# Patient Record
Sex: Male | Born: 1965 | Race: Black or African American | Hispanic: No | Marital: Married | Smoking: Never smoker
Health system: Southern US, Community
[De-identification: ages and names within clinical notes are randomized; demographics above are authoritative.]

## PROBLEM LIST (undated history)

## (undated) DIAGNOSIS — F4310 Post-traumatic stress disorder, unspecified: Secondary | ICD-10-CM

## (undated) DIAGNOSIS — F909 Attention-deficit hyperactivity disorder, unspecified type: Secondary | ICD-10-CM

## (undated) DIAGNOSIS — G4733 Obstructive sleep apnea (adult) (pediatric): Secondary | ICD-10-CM

## (undated) DIAGNOSIS — G43909 Migraine, unspecified, not intractable, without status migrainosus: Secondary | ICD-10-CM

## (undated) HISTORY — PX: NECK SURGERY: SHX720

## (undated) HISTORY — DX: Obstructive sleep apnea (adult) (pediatric): G47.33

## (undated) HISTORY — DX: Migraine, unspecified, not intractable, without status migrainosus: G43.909

## (undated) HISTORY — PX: KNEE SURGERY: SHX244

## (undated) HISTORY — DX: Attention-deficit hyperactivity disorder, unspecified type: F90.9

## (undated) HISTORY — DX: Post-traumatic stress disorder, unspecified: F43.10

---

## 2020-02-10 ENCOUNTER — Ambulatory Visit: Payer: Self-pay | Attending: Internal Medicine

## 2020-02-10 DIAGNOSIS — Z23 Encounter for immunization: Secondary | ICD-10-CM

## 2020-02-10 NOTE — Progress Notes (Signed)
   Covid-19 Vaccination Clinic  Name:  Albert Lawrence    MRN: [id number] DOB: May 03, 1966  02/10/2020  [name] was observed post Covid-19 immunization for 15 minutes without incident. He was provided with Vaccine Information Sheet and instruction to access the V-Safe system.   [name] was instructed to call 911 with any severe reactions post vaccine: Marland Kitchen Difficulty breathing  . Swelling of face and throat  . A fast heartbeat  . A bad rash all over body  . Dizziness and weakness   Immunizations Administered    Name Date Dose VIS Date Route   Pfizer COVID-19 Vaccine 02/10/2020  1:09 PM 0.3 mL 10/26/2019 Intramuscular   Manufacturer: [employer], [employer]   Lot: [id number]   NDC: [address]-4

## 2020-03-04 ENCOUNTER — Ambulatory Visit: Payer: Self-pay | Attending: Internal Medicine

## 2020-03-04 DIAGNOSIS — Z23 Encounter for immunization: Secondary | ICD-10-CM

## 2020-03-04 NOTE — Progress Notes (Signed)
   Covid-19 Vaccination Clinic  Name:  Albert Lawrence    MRN: [id number] DOB: Feb 19, 1966  03/04/2020  [name] was observed post Covid-19 immunization for 15 minutes without incident. He was provided with Vaccine Information Sheet and instruction to access the V-Safe system.   [name] was instructed to call 911 with any severe reactions post vaccine: Marland Kitchen Difficulty breathing  . Swelling of face and throat  . A fast heartbeat  . A bad rash all over body  . Dizziness and weakness   Immunizations Administered    Name Date Dose VIS Date Route   Pfizer COVID-19 Vaccine 03/04/2020  1:41 PM 0.3 mL 01/09/2019 Intramuscular   Manufacturer: [employer], [employer]   Lot: [id number]   NDC: [address]-3

## 2020-12-23 ENCOUNTER — Encounter: Payer: Self-pay | Admitting: *Deleted

## 2020-12-23 ENCOUNTER — Other Ambulatory Visit: Payer: Self-pay | Admitting: *Deleted

## 2020-12-24 ENCOUNTER — Encounter: Payer: Self-pay | Admitting: Diagnostic Neuroimaging

## 2020-12-24 ENCOUNTER — Ambulatory Visit (INDEPENDENT_AMBULATORY_CARE_PROVIDER_SITE_OTHER): Payer: BC Managed Care – PPO | Admitting: Diagnostic Neuroimaging

## 2020-12-24 ENCOUNTER — Other Ambulatory Visit: Payer: Self-pay

## 2020-12-24 VITALS — BP 138/80 | HR 88 | Ht 74.0 in | Wt 213.8 lb

## 2020-12-24 DIAGNOSIS — G43109 Migraine with aura, not intractable, without status migrainosus: Secondary | ICD-10-CM

## 2020-12-24 DIAGNOSIS — Z8249 Family history of ischemic heart disease and other diseases of the circulatory system: Secondary | ICD-10-CM

## 2020-12-24 MED ORDER — AJOVY 225 MG/1.5ML ~~LOC~~ SOAJ: 225.0000 mg | SUBCUTANEOUS | 4 refills | Status: DC

## 2020-12-24 MED ORDER — NURTEC 75 MG PO TBDP: 75.0000 mg | ORAL_TABLET | Freq: Every day | ORAL | 6 refills | Status: DC | PRN

## 2020-12-24 NOTE — Progress Notes (Signed)
[facility]  PATIENT: Albert Lawrence DOB: May 06, 1966  REFERRING CLINICIAN: Daiva Huge, MD HISTORY FROM: patient  REASON FOR VISIT: new consult    HISTORICAL  CHIEF COMPLAINT:  Chief Complaint  Patient presents with  . Migraine    Rm 6 New Pt     HISTORY OF PRESENT ILLNESS:   56 year old male here for evaluation of migraines.  Patient has had headaches since he is 55 years old, with frontal, retro-orbital pounding headaches, nausea, fatigue, photophobia, phonophobia, osmophobia and visual scintillating aura.  Triggers include lack of sleep.  Patient has family history of ruptured cerebral aneurysms in his mother and maternal grandfather.  He had MRI scans many years ago which apparently were unremarkable.  Patient has 5 headaches per month each lasting 2 to 3 days at a time.  Patient has tried Tylenol, ibuprofen, Excedrin, sumatriptan without relief.    REVIEW OF SYSTEMS: Full 14 system review of systems performed and negative with exception of: As per HPI.  ALLERGIES: Allergies  Allergen Reactions  . Almond Oil Itching  . Latex Itching  . Eggs Or Egg-Derived Products Swelling  . Mango Flavor Swelling    Throat closed up, itching  . Trazodone And Nefazodone     Drowsy, numbness    HOME MEDICATIONS: Outpatient Medications Prior to Visit  Medication Sig Dispense Refill  . AMLODIPINE BESYLATE PO Take 10 mg by mouth daily.    Marland Kitchen amphetamine-dextroamphetamine (ADDERALL) 20 MG tablet Take 20 mg by mouth daily. At noon    . amphetamine-dextroamphetamine (ADDERALL) 30 MG tablet Take 30 mg by mouth daily. In morning    . chlorthalidone (HYGROTON) 25 MG tablet TAKE ONE TABLET BY MOUTH DAILY FOR BLOOD PRESSURE    . Cholecalciferol 25 MCG (1000 UT) tablet TAKE ONE TABLET BY MOUTH DAILY  - BEGIN TAKING AFTER YOU COMPLETE THE 8 WEEKS OF HIGHER DOSE VITAMIN D    . losartan (COZAAR) 50 MG tablet Take 50 mg by mouth daily.    . naproxen (NAPROSYN) 500 MG  tablet TAKE ONE TABLET BY MOUTH TWICE A DAY WITH MEALS AS NEEDED FOR PAIN (TAKE WITH FOOD)    . omeprazole (PRILOSEC) 20 MG capsule Take by mouth.    . SUMAtriptan (IMITREX) 100 MG tablet TAKE ONE TABLET BY MOUTH AS DIRECTED (FIRST DOSE AT ONSET OF HEADACHE,THEN REPEAT AFTER 2 HOURS IF NO RELIEF-NO MORE THAN 200 MG PER DAY)    . thiamine 100 MG tablet Take 200 mg by mouth daily.    Marland Kitchen amphetamine-dextroamphetamine (ADDERALL XR) 20 MG 24 hr capsule Take 20 mg by mouth daily. At noon    . hydrochlorothiazide (HYDRODIURIL) 25 MG tablet Take 25 mg by mouth daily.    . sildenafil (VIAGRA) 100 MG tablet SMARTSIG:1 Tablet(s) By Mouth (Patient not taking: Reported on 12/24/2020)     No facility-administered medications prior to visit.    PAST MEDICAL HISTORY: Past Medical History:  Diagnosis Date  . ADHD   . Migraine   . OSA (obstructive sleep apnea)   . PTSD (post-traumatic stress disorder)     PAST SURGICAL HISTORY: Past Surgical History:  Procedure Laterality Date  . KNEE SURGERY Bilateral   . NECK SURGERY     "nerve trapped between disks"    FAMILY HISTORY: No family history on file.  SOCIAL HISTORY: Social History   Socioeconomic History  . Marital status: Married    Spouse name: Not on file  . Number of children: 5  . Years of education:  Not on file  . Highest education level: Some college, no degree  Occupational History    Comment: sheriff's office  Tobacco Use  . Smoking status: Never Smoker  . Smokeless tobacco: Never Used  Substance and Sexual Activity  . Alcohol use: Not Currently  . Drug use: Never  . Sexual activity: Not on file  Other Topics Concern  . Not on file  Social History Narrative   Lives with wife, child   Caffeine- not much   Social Determinants of Health   Financial Resource Strain: Not on file  Food Insecurity: Not on file  Transportation Needs: Not on file  Physical Activity: Not on file  Stress: Not on file  Social Connections: Not on  file  Intimate Partner Violence: Not on file     PHYSICAL EXAM  GENERAL EXAM/CONSTITUTIONAL: Vitals:  Vitals:   12/24/20 0956  BP: 138/80  Pulse: 88  Weight: 213 lb 12.8 oz (97 kg)  Height: 6\' 2"  (1.88 m)     Body mass index is 27.45 kg/m. Wt Readings from Last 3 Encounters:  12/24/20 213 lb 12.8 oz (97 kg)     Patient is in no distress; well developed, nourished and groomed; neck is supple  CARDIOVASCULAR:  Examination of carotid arteries is normal; no carotid bruits  Regular rate and rhythm, no murmurs  Examination of peripheral vascular system by observation and palpation is normal  EYES:  Ophthalmoscopic exam of optic discs and posterior segments is normal; no papilledema or hemorrhages  No exam data present  MUSCULOSKELETAL:  Gait, strength, tone, movements noted in Neurologic exam below  NEUROLOGIC: MENTAL STATUS:  No flowsheet data found.  awake, alert, oriented to person, place and time  recent and remote memory intact  normal attention and concentration  language fluent, comprehension intact, naming intact  fund of knowledge appropriate  CRANIAL NERVE:   2nd - no papilledema on fundoscopic exam  2nd, 3rd, 4th, 6th - pupils equal and reactive to light, visual fields full to confrontation, extraocular muscles intact, no nystagmus  5th - facial sensation symmetric  7th - facial strength symmetric  8th - hearing intact  9th - palate elevates symmetrically, uvula midline  11th - shoulder shrug symmetric  12th - tongue protrusion midline  MOTOR:   normal bulk and tone, full strength in the BUE, BLE  SENSORY:   normal and symmetric to light touch, temperature, vibration  COORDINATION:   finger-nose-finger, fine finger movements normal  REFLEXES:   deep tendon reflexes present and symmetric  GAIT/STATION:   narrow based gait     DIAGNOSTIC DATA (LABS, IMAGING, TESTING) - I reviewed patient records, labs, notes,  testing and imaging myself where available.  No results found for: WBC, HGB, HCT, MCV, PLT No results found for: NA, K, CL, CO2, GLUCOSE, BUN, CREATININE, CALCIUM, PROT, ALBUMIN, AST, ALT, ALKPHOS, BILITOT, GFRNONAA, GFRAA No results found for: CHOL, HDL, LDLCALC, LDLDIRECT, TRIG, CHOLHDL No results found for: 02/21/21 No results found for: VITAMINB12 No results found for: TSH     ASSESSMENT AND PLAN  55 y.o. year old male here with migraine with aura since age 60 years old, with ongoing headaches.  Also family history of ruptured cerebral aneurysms.  Dx:  1. Migraine with aura and without status migrainosus, not intractable   2. Family history of cerebral aneurysm     PLAN:  MIGRAINE PREVENTION  LIFESTYLE CHANGES -Stop or avoid smoking -Decrease or avoid caffeine / alcohol -Eat and sleep on a regular  schedule -Exercise several times per week - start fremanezumab (Ajovy) 225mg  monthly (or 675mg  every 3 months)  MIGRAINE RESCUE  - ibuprofen, tylenol as needed - rimegepant (Nurtec) 75mg  as needed for breakthrough headache; max 8 per month  FAMILY HISTORY OF RUPTURED CEREBRAL ANEURYSMS (mother, maternal grand father) - MRI, MRA head screening testing   Orders Placed This Encounter  Procedures  . MR BRAIN W WO CONTRAST  . MR ANGIO HEAD WO CONTRAST   Meds ordered this encounter  Medications  . Fremanezumab-vfrm (AJOVY) 225 MG/1.5ML SOAJ    Sig: Inject 225 mg into the skin every 30 (thirty) days.    Dispense:  4.5 mL    Refill:  4  . Rimegepant Sulfate (NURTEC) 75 MG TBDP    Sig: Take 75 mg by mouth daily as needed.    Dispense:  8 tablet    Refill:  6   Return in about 6 months (around 06/23/2021).    , MD 12/24/2020, 10:38 AM Certified in Neurology, Neurophysiology and Neuroimaging  [facility] [address] [place], [place] [address] [phone]

## 2020-12-24 NOTE — Patient Instructions (Signed)
MIGRAINE PREVENTION  LIFESTYLE CHANGES -Stop or avoid smoking -Decrease or avoid caffeine / alcohol -Eat and sleep on a regular schedule -Exercise several times per week - start fremanezumab (Ajovy) 225mg  monthly  MIGRAINE RESCUE  - ibuprofen, tylenol as needed - rimegepant (Nurtec) 75mg  as needed for breakthrough headache; max 8 per month  FAMILY HISTORY OF ANEURYSMS (mother, maternal grand father) - MRI, MRA head screening testing

## 2020-12-24 NOTE — Progress Notes (Signed)
For headache management pt has tried and failed Imitrex, Excedrin Migraine, Tylenol, Aleve. Migraines for years, average 5 x a month lasting 2-3 days, no headaches.

## 2020-12-25 ENCOUNTER — Telehealth: Payer: Self-pay | Admitting: Diagnostic Neuroimaging

## 2020-12-25 NOTE — Telephone Encounter (Signed)
BC[employer] Auth: NPR spoke to [place] ref # [id number] order sent to [facility]. They will reach out to the patient to schedule.

## 2021-01-11 ENCOUNTER — Ambulatory Visit
Admission: RE | Admit: 2021-01-11 | Discharge: 2021-01-11 | Disposition: A | Discharge status: Home / Self Care | Payer: BC Managed Care – PPO | Source: Ambulatory Visit | Attending: Diagnostic Neuroimaging | Admitting: Diagnostic Neuroimaging

## 2021-01-11 DIAGNOSIS — Z8249 Family history of ischemic heart disease and other diseases of the circulatory system: Secondary | ICD-10-CM

## 2021-01-11 DIAGNOSIS — G43109 Migraine with aura, not intractable, without status migrainosus: Secondary | ICD-10-CM

## 2021-01-11 MED ORDER — GADOBENATE DIMEGLUMINE 529 MG/ML IV SOLN
20.0000 mL | Freq: Once | INTRAVENOUS | Status: AC | PRN
  Administered 2021-01-11: 20 mL via INTRAVENOUS

## 2021-01-12 ENCOUNTER — Telehealth: Payer: Self-pay | Admitting: *Deleted

## 2021-01-12 DIAGNOSIS — G43109 Migraine with aura, not intractable, without status migrainosus: Secondary | ICD-10-CM

## 2021-01-12 MED ORDER — AJOVY 225 MG/1.5ML ~~LOC~~ SOAJ: 225.0000 mg | SUBCUTANEOUS | 3 refills | Status: DC

## 2021-01-12 MED ORDER — NURTEC 75 MG PO TBDP: 75.0000 mg | ORAL_TABLET | Freq: Every day | ORAL | 5 refills | Status: DC | PRN

## 2021-01-12 NOTE — Telephone Encounter (Signed)
LVM requesting call back for MRA head, MRI brain results.

## 2021-01-12 NOTE — Addendum Note (Signed)
Addended by: [place] Pink on: 01/12/2021 04:40 PM   Modules accepted: Orders

## 2021-01-12 NOTE — Addendum Note (Signed)
Addended by: Joycelyn Schmid R on: 01/12/2021 03:51 PM   Modules accepted: Orders

## 2021-01-12 NOTE — Telephone Encounter (Signed)
Received call from [facility] re: MRA head without contrast. She stated Dr Yetta Barre would like Dr Marjory Lies to be notified of results re: approximately 2 mm outpouching which could represent aneurysm.  Results printed, placed on Dr [facility] desk for review.

## 2021-01-12 NOTE — Telephone Encounter (Addendum)
Patient called back and I informed him the MRA head showed a small outpouching; possibly small aneurysm vs vessel bulge. Dr Marjory Lies ordered CTA head to confirm. It is not serious at this time, but he will monitor. I informed him the MRI brain is normal. He stated his Rx went to [facility] instead of [place] in [place]. He used savings card and picked up Ajovy and Nurtec but wants them sent to [place]. I advised can make that change.  Ajovy and Nurtec Rx discontinued at [facility] and sent to [facility].

## 2021-01-13 ENCOUNTER — Telehealth: Payer: Self-pay | Admitting: Diagnostic Neuroimaging

## 2021-01-13 NOTE — Telephone Encounter (Signed)
BCBS Auth: NPR spoke to [place] ref # [id number] order sent to GI they will reach out to the patient to schedule.

## 2021-01-29 ENCOUNTER — Other Ambulatory Visit: Payer: Self-pay

## 2021-01-29 ENCOUNTER — Ambulatory Visit
Admission: RE | Admit: 2021-01-29 | Discharge: 2021-01-29 | Disposition: A | Discharge status: Home / Self Care | Payer: Non-veteran care | Source: Ambulatory Visit | Attending: Diagnostic Neuroimaging | Admitting: Diagnostic Neuroimaging

## 2021-01-29 DIAGNOSIS — Z8249 Family history of ischemic heart disease and other diseases of the circulatory system: Secondary | ICD-10-CM

## 2021-01-29 MED ORDER — IOPAMIDOL (ISOVUE-370) INJECTION 76%
75.0000 mL | Freq: Once | INTRAVENOUS | Status: AC | PRN
  Administered 2021-01-29: 60 mL via INTRAVENOUS

## 2021-01-30 ENCOUNTER — Telehealth: Payer: Self-pay | Admitting: *Deleted

## 2021-01-30 NOTE — Telephone Encounter (Signed)
Called patient and informed his CT head scan showed unremarkable imaging results.  Continue current plan of migraine medications. He stated that he had to pay OOP for Ajovy at [place], [place] for Nurtec. He stated he got Ajovy from [place], not [place] and [place] MD said that he;d try to get it approved. I advised him typically the pharmacy sends [place] a message to get prior auth on both those meds. He stated his [place] insurance should pay for them. He will call [facility] to check on [place], will call if any issues. He stated his CT scan should have been billed to [facility] also, not [employer]. I advised will check into that. Patient verbalized understanding, appreciation.

## 2021-02-02 NOTE — Telephone Encounter (Signed)
Called patient and advised he call [facility] to have them resubmit CT scan bill to [employer] instead of [facility]. He stated he has done that, but he has a bill we charged to [place] instead of [place]. I advised will have Angie, billing dept investigate. He verbalized understanding, appreciation.

## 2021-07-07 ENCOUNTER — Ambulatory Visit: Payer: No Typology Code available for payment source | Admitting: Diagnostic Neuroimaging

## 2021-07-07 ENCOUNTER — Encounter: Payer: Self-pay | Admitting: Diagnostic Neuroimaging

## 2022-04-13 ENCOUNTER — Encounter: Payer: Self-pay | Admitting: *Deleted

## 2022-04-15 ENCOUNTER — Encounter: Payer: Self-pay | Admitting: Diagnostic Neuroimaging

## 2022-04-15 ENCOUNTER — Ambulatory Visit (INDEPENDENT_AMBULATORY_CARE_PROVIDER_SITE_OTHER): Payer: No Typology Code available for payment source | Admitting: Diagnostic Neuroimaging

## 2022-04-15 VITALS — BP 135/85 | HR 93 | Ht 74.0 in | Wt 210.1 lb

## 2022-04-15 DIAGNOSIS — G43109 Migraine with aura, not intractable, without status migrainosus: Secondary | ICD-10-CM

## 2022-04-15 MED ORDER — EMGALITY 120 MG/ML ~~LOC~~ SOAJ: 120.0000 mg | SUBCUTANEOUS | 4 refills | Status: DC

## 2022-04-15 MED ORDER — UBRELVY 50 MG PO TABS
50.0000 mg | ORAL_TABLET | ORAL | 6 refills | Status: DC | PRN
Start: 2022-04-15 — End: 2022-10-20

## 2022-04-15 NOTE — Progress Notes (Signed)
[facility]  PATIENT: Albert Lawrence DOB: 1966-03-14  REFERRING CLINICIAN: Bedelia Person, MD HISTORY FROM: patient  REASON FOR VISIT: follow up   HISTORICAL  CHIEF COMPLAINT:  Chief Complaint  Patient presents with   Follow-up    Rm 7 returning today for f/u on migraines. Last visit was in 2022. Pt reports sx are the same. Over the last 30 days he reports 5 migraines days. Was previously taking ajovy but stopped (did not feel like it was helping)     HISTORY OF PRESENT ILLNESS:   UPDATE (04/15/22, VRP): Since last visit, doing about the same. Had ajovy and nurtec x 12 months, and slight benefit, but not able to get meds via [place]. Stil with 5 HA per month.   PRIOR HPI (12/24/20): 56 year old male here for evaluation of migraines.  Patient has had headaches since he is 56 years old, with frontal, retro-orbital pounding headaches, nausea, fatigue, photophobia, phonophobia, osmophobia and visual scintillating aura.  Triggers include lack of sleep.  Patient has family history of ruptured cerebral aneurysms in his mother and maternal grandfather.  He had MRI scans many years ago which apparently were unremarkable.  Patient has 5 headaches per month each lasting 2 to 3 days at a time.  Patient has tried Tylenol, ibuprofen, Excedrin, sumatriptan without relief.    REVIEW OF SYSTEMS: Full 14 system review of systems performed and negative with exception of: As per HPI.  ALLERGIES: Allergies  Allergen Reactions   Almond Oil Itching   Latex Itching   Eggs Or Egg-Derived Products Swelling   Mango Flavor Swelling    Throat closed up, itching   Trazodone And Nefazodone     Drowsy, numbness    HOME MEDICATIONS: Outpatient Medications Prior to Visit  Medication Sig Dispense Refill   AMLODIPINE BESYLATE PO Take 10 mg by mouth daily.     amphetamine-dextroamphetamine (ADDERALL) 30 MG tablet Take 30 mg by mouth daily. In morning     chlorthalidone (HYGROTON) 25 MG tablet  TAKE ONE TABLET BY MOUTH DAILY FOR BLOOD PRESSURE     Cholecalciferol 25 MCG (1000 UT) tablet TAKE ONE TABLET BY MOUTH DAILY  - BEGIN TAKING AFTER YOU COMPLETE THE 8 WEEKS OF HIGHER DOSE VITAMIN D     losartan (COZAAR) 50 MG tablet Take 50 mg by mouth daily.     naproxen (NAPROSYN) 500 MG tablet TAKE ONE TABLET BY MOUTH TWICE A DAY WITH MEALS AS NEEDED FOR PAIN (TAKE WITH FOOD)     omeprazole (PRILOSEC) 20 MG capsule Take by mouth.     thiamine 100 MG tablet Take 200 mg by mouth daily.     Fremanezumab-vfrm (AJOVY) 225 MG/1.5ML SOAJ Inject 225 mg into the skin every 30 (thirty) days. 4.5 mL 3   amphetamine-dextroamphetamine (ADDERALL) 20 MG tablet Take 20 mg by mouth daily. At noon     Rimegepant Sulfate (NURTEC) 75 MG TBDP Take 75 mg by mouth daily as needed. (Patient not taking: Reported on 04/15/2022) 8 tablet 5   SUMAtriptan (IMITREX) 100 MG tablet TAKE ONE TABLET BY MOUTH AS DIRECTED (FIRST DOSE AT ONSET OF HEADACHE,THEN REPEAT AFTER 2 HOURS IF NO RELIEF-NO MORE THAN 200 MG PER DAY) (Patient not taking: Reported on 04/15/2022)     No facility-administered medications prior to visit.    PAST MEDICAL HISTORY: Past Medical History:  Diagnosis Date   ADHD    Migraine    OSA (obstructive sleep apnea)    PTSD (post-traumatic stress disorder)  PAST SURGICAL HISTORY: Past Surgical History:  Procedure Laterality Date   KNEE SURGERY Bilateral    NECK SURGERY     "nerve trapped between disks"    FAMILY HISTORY: No family history on file.  SOCIAL HISTORY: Social History   Socioeconomic History   Marital status: Married    Spouse name: Not on file   Number of children: 5   Years of education: Not on file   Highest education level: Some college, no degree  Occupational History    Comment: sheriff's office  Tobacco Use   Smoking status: Never   Smokeless tobacco: Never  Substance and Sexual Activity   Alcohol use: Not Currently   Drug use: Never   Sexual activity: Not on file   Other Topics Concern   Not on file  Social History Narrative   Lives with wife, child   Caffeine- not much   Social Determinants of Health   Financial Resource Strain: Not on file  Food Insecurity: Not on file  Transportation Needs: Not on file  Physical Activity: Not on file  Stress: Not on file  Social Connections: Not on file  Intimate Partner Violence: Not on file     PHYSICAL EXAM  GENERAL EXAM/CONSTITUTIONAL: Vitals:  Vitals:   04/15/22 1317  BP: 135/85  Pulse: 93  Weight: 210 lb 2 oz (95.3 kg)  Height: 6\' 2"  (1.88 m)   Body mass index is 26.98 kg/m. Wt Readings from Last 3 Encounters:  04/15/22 210 lb 2 oz (95.3 kg)  12/24/20 213 lb 12.8 oz (97 kg)   Patient is in no distress; well developed, nourished and groomed; neck is supple  CARDIOVASCULAR: Examination of carotid arteries is normal; no carotid bruits Regular rate and rhythm, no murmurs Examination of peripheral vascular system by observation and palpation is normal  EYES: Ophthalmoscopic exam of optic discs and posterior segments is normal; no papilledema or hemorrhages No results found.  MUSCULOSKELETAL: Gait, strength, tone, movements noted in Neurologic exam below  NEUROLOGIC: MENTAL STATUS:      View : No data to display.         awake, alert, oriented to person, place and time recent and remote memory intact normal attention and concentration language fluent, comprehension intact, naming intact fund of knowledge appropriate  CRANIAL NERVE:  2nd - no papilledema on fundoscopic exam 2nd, 3rd, 4th, 6th - pupils equal and reactive to light, visual fields full to confrontation, extraocular muscles intact, no nystagmus 5th - facial sensation symmetric 7th - facial strength symmetric 8th - hearing intact 9th - palate elevates symmetrically, uvula midline 11th - shoulder shrug symmetric 12th - tongue protrusion midline  MOTOR:  normal bulk and tone, full strength in the BUE,  BLE  SENSORY:  normal and symmetric to light touch, temperature, vibration  COORDINATION:  finger-nose-finger, fine finger movements normal  REFLEXES:  deep tendon reflexes present and symmetric  GAIT/STATION:  narrow based gait     DIAGNOSTIC DATA (LABS, IMAGING, TESTING) - I reviewed patient records, labs, notes, testing and imaging myself where available.  No results found for: WBC, HGB, HCT, MCV, PLT No results found for: NA, K, CL, CO2, GLUCOSE, BUN, CREATININE, CALCIUM, PROT, ALBUMIN, AST, ALT, ALKPHOS, BILITOT, GFRNONAA, GFRAA No results found for: CHOL, HDL, LDLCALC, LDLDIRECT, TRIG, CHOLHDL No results found for: [id number] No results found for: VITAMINB12 No results found for: TSH   01/11/22 Normal MRI brain (with and without).   01/11/22 MRA head  1. Approximately 2 mm inferolaterally  directed outpouching arising from the paraclinoid left ICA, which could represent an aneurysm or infundibulum with vessel poorly visualized by noncontrast MRA technique. Consider CTA on follow-up for better characterization, including more accurate determination of size. 2. No evidence of proximal hemodynamically significant stenosis.  01/29/22 CTA head  - 1 mm infundibulum left posterior communicating artery origin on the internal carotid artery accounts for the MRA finding. No aneurysm identified - No intracranial stenosis.    ASSESSMENT AND PLAN  56 y.o. year old male here with migraine with aura since age 57 years old, with ongoing headaches.  Also family history of ruptured cerebral aneurysms.  Patient has tried Tylenol, ibuprofen, Excedrin, sumatriptan, nurtec, ajovy.   Dx:  1. Migraine with aura and without status migrainosus, not intractable      PLAN:  MIGRAINE PREVENTION  LIFESTYLE CHANGES -Stop or avoid smoking -Decrease or avoid caffeine / alcohol -Eat and sleep on a regular schedule -Exercise several times per week - start emgality injections - may  consider topiramate, propranolol in future  MIGRAINE RESCUE  - ibuprofen, tylenol as needed - start ubrelvy rescue med  FAMILY HISTORY OF RUPTURED CEREBRAL ANEURYSMS (mother, maternal grand father) - workup completed; no major aneurysm; monitor BP; avoid smoking  Meds ordered this encounter  Medications   Galcanezumab-gnlm (EMGALITY) 120 MG/ML SOAJ    Sig: Inject 120 mg into the skin every 30 (thirty) days. Start 240mg  injection loading dose x 1; after 1 month start 120mg  monthly injections    Dispense:  4 mL    Refill:  4   Ubrogepant (UBRELVY) 50 MG TABS    Sig: Take 50 mg by mouth as needed. May repeat x 1 tab after 2 hours; max 2 tabs per day or 8 per month    Dispense:  8 tablet    Refill:  6   Return in about 6 months (around 10/15/2022) for with NP (Amy Lomax).    , MD 04/15/2022, 1:52 PM Certified in Neurology, Neurophysiology and Neuroimaging  [facility] [address] [place], [address] [place] [phone]

## 2022-08-18 ENCOUNTER — Emergency Department (HOSPITAL_COMMUNITY): Payer: No Typology Code available for payment source

## 2022-08-18 ENCOUNTER — Other Ambulatory Visit: Payer: Self-pay

## 2022-08-18 ENCOUNTER — Emergency Department (HOSPITAL_COMMUNITY)
Admission: EM | Admit: 2022-08-18 | Discharge: 2022-08-19 | Disposition: A | Discharge status: Home / Self Care | Payer: No Typology Code available for payment source | Attending: Emergency Medicine | Admitting: Emergency Medicine

## 2022-08-18 ENCOUNTER — Encounter (HOSPITAL_COMMUNITY): Payer: Self-pay

## 2022-08-18 DIAGNOSIS — Z91040 Latex allergy status: Secondary | ICD-10-CM | POA: Diagnosis not present

## 2022-08-18 DIAGNOSIS — Z79899 Other long term (current) drug therapy: Secondary | ICD-10-CM | POA: Diagnosis not present

## 2022-08-18 DIAGNOSIS — M542 Cervicalgia: Secondary | ICD-10-CM

## 2022-08-18 DIAGNOSIS — M4802 Spinal stenosis, cervical region: Secondary | ICD-10-CM | POA: Insufficient documentation

## 2022-08-18 DIAGNOSIS — M5412 Radiculopathy, cervical region: Secondary | ICD-10-CM | POA: Diagnosis not present

## 2022-08-18 MED ORDER — KETOROLAC TROMETHAMINE 60 MG/2ML IM SOLN
60.0000 mg | Freq: Once | INTRAMUSCULAR | Status: AC
  Administered 2022-08-18: 60 mg via INTRAMUSCULAR
  Filled 2022-08-18: qty 2

## 2022-08-18 MED ORDER — METHYLPREDNISOLONE 4 MG PO TBPK: ORAL_TABLET | ORAL | 0 refills | Status: AC

## 2022-08-18 MED ORDER — LIDOCAINE 5 % EX PTCH
1.0000 | MEDICATED_PATCH | CUTANEOUS | Status: DC
  Administered 2022-08-18: 1 via TRANSDERMAL
  Filled 2022-08-18: qty 1

## 2022-08-18 MED ORDER — CYCLOBENZAPRINE HCL 10 MG PO TABS: 5.0000 mg | ORAL_TABLET | Freq: Three times a day (TID) | ORAL | 0 refills | Status: AC | PRN

## 2022-08-18 MED ORDER — CYCLOBENZAPRINE HCL 10 MG PO TABS
5.0000 mg | ORAL_TABLET | Freq: Once | ORAL | Status: AC
  Administered 2022-08-18: 5 mg via ORAL
  Filled 2022-08-18: qty 1
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
## 2022-08-18 NOTE — ED Notes (Signed)
Pt ambulatory without assistance.  

## 2022-08-19 NOTE — Discharge Instructions (Signed)
You were seen for neck pain and arm pain that we believe is due to a nerve pain or radiculopathy as we discussed.  Your CT scan of the neck showed no fractures but did show evidence of spinal stenosis which you can read about in your discharge paperwork.  This is consistent with your pain that we believe is due to nerve pain or pinched nerves in your neck.  Continue to take ibuprofen 600 mg every 6-8 hours for pain.  We have also given you a steroid Dosepak to help with the swelling and inflammation.  You can take the Flexeril or muscle relaxer as needed for pain control.  We have referred you to neurosurgery.  [facility] to [facility].  Come back for any severe worsening pain, loss of sensation, loss of strength, or any other symptoms concerning to you.

## 2022-10-19 NOTE — Progress Notes (Unsigned)
No chief complaint on file.   HISTORY OF PRESENT ILLNESS:  10/19/22 ALL:  Albert Lawrence is a 55 y.o. male here today for follow up for migraines. She was last seen by Dr Marjory Lies 04/2022 and switched from Ajovy to [place]. Albert Lawrence started for abortive therapy. Since.    HISTORY (copied from Dr Richrd Humbles previous note)  UPDATE (04/15/22, VRP): Since last visit, doing about the same. Had ajovy and nurtec x 12 months, and slight benefit, but not able to get meds via [place]. Stil with 5 HA per month.    PRIOR HPI (12/24/20): 56 year old male here for evaluation of migraines.  Patient has had headaches since he is 56 years old, with frontal, retro-orbital pounding headaches, nausea, fatigue, photophobia, phonophobia, osmophobia and visual scintillating aura.  Triggers include lack of sleep.  Patient has family history of ruptured cerebral aneurysms in his mother and maternal grandfather.  He had MRI scans many years ago which apparently were unremarkable.   Patient has 5 headaches per month each lasting 2 to 3 days at a time.   Patient has tried Tylenol, ibuprofen, Excedrin, sumatriptan without relief.     REVIEW OF SYSTEMS: Out of a complete 14 system review of symptoms, the patient complains only of the following symptoms, and all other reviewed systems are negative.   ALLERGIES: Allergies  Allergen Reactions   Almond Oil Itching   Latex Itching   Eggs Or Egg-Derived Products Swelling   Mango Flavor Swelling    Throat closed up, itching   Trazodone And Nefazodone     Drowsy, numbness     HOME MEDICATIONS: Outpatient Medications Prior to Visit  Medication Sig Dispense Refill   AMLODIPINE BESYLATE PO Take 10 mg by mouth daily.     amphetamine-dextroamphetamine (ADDERALL) 30 MG tablet Take 30 mg by mouth daily. In morning     chlorthalidone (HYGROTON) 25 MG tablet TAKE ONE TABLET BY MOUTH DAILY FOR BLOOD PRESSURE     Cholecalciferol 25 MCG (1000 UT) tablet TAKE ONE TABLET BY  MOUTH DAILY  - BEGIN TAKING AFTER YOU COMPLETE THE 8 WEEKS OF HIGHER DOSE VITAMIN D     cyclobenzaprine (FLEXERIL) 10 MG tablet Take 0.5 tablets (5 mg total) by mouth 3 (three) times daily as needed for up to 60 doses for muscle spasms. 30 tablet 0   Galcanezumab-gnlm (EMGALITY) 120 MG/ML SOAJ Inject 120 mg into the skin every 30 (thirty) days. Start 240mg  injection loading dose x 1; after 1 month start 120mg  monthly injections 4 mL 4   losartan (COZAAR) 50 MG tablet Take 50 mg by mouth daily.     methylPREDNISolone (MEDROL DOSEPAK) 4 MG TBPK tablet Follow instructions on dose-pack. 21 each 0   naproxen (NAPROSYN) 500 MG tablet TAKE ONE TABLET BY MOUTH TWICE A DAY WITH MEALS AS NEEDED FOR PAIN (TAKE WITH FOOD)     omeprazole (PRILOSEC) 20 MG capsule Take by mouth.     thiamine 100 MG tablet Take 200 mg by mouth daily.     Ubrogepant (UBRELVY) 50 MG TABS Take 50 mg by mouth as needed. May repeat x 1 tab after 2 hours; max 2 tabs per day or 8 per month 8 tablet 6   No facility-administered medications prior to visit.     PAST MEDICAL HISTORY: Past Medical History:  Diagnosis Date   ADHD    Migraine    OSA (obstructive sleep apnea)    PTSD (post-traumatic stress disorder)      PAST SURGICAL  HISTORY: Past Surgical History:  Procedure Laterality Date   KNEE SURGERY Bilateral    NECK SURGERY     "nerve trapped between disks"     FAMILY HISTORY: No family history on file.   SOCIAL HISTORY: Social History   Socioeconomic History   Marital status: Married    Spouse name: Not on file   Number of children: 5   Years of education: Not on file   Highest education level: Some college, no degree  Occupational History    Comment: [employer]  Tobacco Use   Smoking status: Never   Smokeless tobacco: Never  Substance and Sexual Activity   Alcohol use: Not Currently   Drug use: Never   Sexual activity: Not on file  Other Topics Concern   Not on file  Social History  Narrative   Lives with wife, child   Caffeine- not much   Social Determinants of Health   Financial Resource Strain: Not on file  Food Insecurity: Not on file  Transportation Needs: Not on file  Physical Activity: Not on file  Stress: Not on file  Social Connections: Not on file  Intimate Partner Violence: Not on file     PHYSICAL EXAM  There were no vitals filed for this visit. There is no height or weight on file to calculate BMI.  Generalized: Well developed, in no acute distress  Cardiology: normal rate and rhythm, no murmur auscultated  Respiratory: clear to auscultation bilaterally    Neurological examination  Mentation: Alert oriented to time, place, history taking. Follows all commands speech and language fluent Cranial nerve II-XII: Pupils were equal round reactive to light. Extraocular movements were full, visual field were full on confrontational test. Facial sensation and strength were normal. Uvula tongue midline. Head turning and shoulder shrug  were normal and symmetric. Motor: The motor testing reveals 5 over 5 strength of all 4 extremities. Good symmetric motor tone is noted throughout.  Sensory: Sensory testing is intact to soft touch on all 4 extremities. No evidence of extinction is noted.  Coordination: Cerebellar testing reveals good finger-nose-finger and heel-to-shin bilaterally.  Gait and station: Gait is normal. Tandem gait is normal. Romberg is negative. No drift is seen.  Reflexes: Deep tendon reflexes are symmetric and normal bilaterally.    DIAGNOSTIC DATA (LABS, IMAGING, TESTING) - I reviewed patient records, labs, notes, testing and imaging myself where available.  No results found for: "WBC", "HGB", "HCT", "MCV", "PLT" No results found for: "NA", "K", "CL", "CO2", "GLUCOSE", "BUN", "CREATININE", "CALCIUM", "PROT", "ALBUMIN", "AST", "ALT", "ALKPHOS", "BILITOT", "GFRNONAA", "GFRAA" No results found for: "CHOL", "HDL", "LDLCALC", "LDLDIRECT",  "TRIG", "CHOLHDL" No results found for: "HGBA1C" No results found for: "VITAMINB12" No results found for: "TSH"      No data to display               No data to display           ASSESSMENT AND PLAN  56 y.o. year old male  has a past medical history of ADHD, Migraine, OSA (obstructive sleep apnea), and PTSD (post-traumatic stress disorder). here with    No diagnosis found.  Johney [place] ***.  Healthy lifestyle habits encouraged. *** will follow up with PCP as directed. *** will return to see me in ***, sooner if needed. *** verbalizes understanding and agreement with this plan.   No orders of the defined types were placed in this encounter.    No orders of the defined types were placed in  this encounter.    Shawnie Dapper, MSN, FNP-C 10/19/2022, 3:54 PM  [facility] [address] [place], [place] [address] [phone]

## 2022-10-19 NOTE — Patient Instructions (Incomplete)
Below is our plan:  We will resent rx for [place]. You will take 2 injections of Emgality the first month then continue 1 injection every 30 days. Take [name] only as needed to stop migraine. Use copay code online at [email or web address] and [email or web address] with BCBS coverage. Let me know if you have any trouble getting meds.   Please make sure you are staying well hydrated. I recommend 50-60 ounces daily. Well balanced diet and regular exercise encouraged. Consistent sleep schedule with 6-8 hours recommended.   Please continue follow up with care team as directed.   Follow up with me in 6 months   You may receive a survey regarding today's visit. I encourage you to leave honest feed back as I do use this information to improve patient care. Thank you for seeing me today!

## 2022-10-20 ENCOUNTER — Ambulatory Visit (INDEPENDENT_AMBULATORY_CARE_PROVIDER_SITE_OTHER): Payer: No Typology Code available for payment source | Admitting: Family Medicine

## 2022-10-20 ENCOUNTER — Encounter: Payer: Self-pay | Admitting: Family Medicine

## 2022-10-20 VITALS — BP 122/95 | HR 94 | Ht 74.0 in | Wt 216.0 lb

## 2022-10-20 DIAGNOSIS — G43109 Migraine with aura, not intractable, without status migrainosus: Secondary | ICD-10-CM | POA: Diagnosis not present

## 2022-10-20 MED ORDER — UBRELVY 50 MG PO TABS: 50.0000 mg | ORAL_TABLET | ORAL | 6 refills | Status: AC | PRN

## 2022-10-20 MED ORDER — EMGALITY 120 MG/ML ~~LOC~~ SOAJ: 120.0000 mg | SUBCUTANEOUS | 4 refills | Status: AC

## 2022-11-13 IMAGING — CT CT ANGIO HEAD
2 of 4 series · 6 of 30 positions shown · IV contrast (iopamidol)
Comparison: MRA head 01/11/2021

CLINICAL DATA: Rule out aneurysm.  Abnormal recent MRA

Creatinine was obtained on site at [HOSPITAL] at [HOSPITAL].
Results: Creatinine 1.9 mg/dL.
EXAM:
CT ANGIOGRAPHY HEAD
TECHNIQUE: Multidetector CT imaging of the head was performed using the
standard protocol during bolus administration of intravenous
contrast. Multiplanar CT image reconstructions and MIPs were
obtained to evaluate the vascular anatomy.
CONTRAST:  60mL SST7DZ-BR3 IOPAMIDOL (SST7DZ-BR3) INJECTION 76%

[Series 4: head w/(date) · axial · 0.48mm/px · z∈[-118,-58]mm · 2 of 36 slices shown]
[im 12/36  brain]
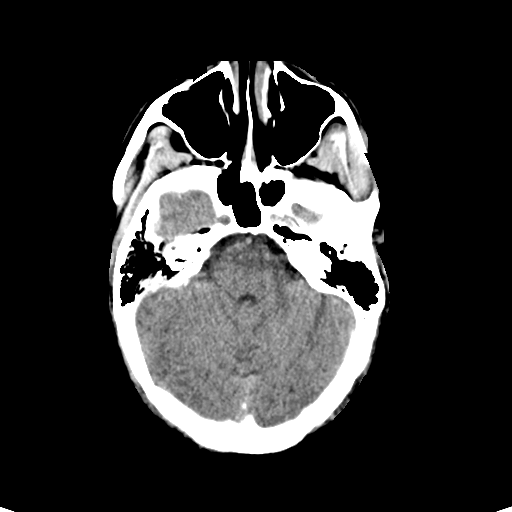
[im 24/36  brain]
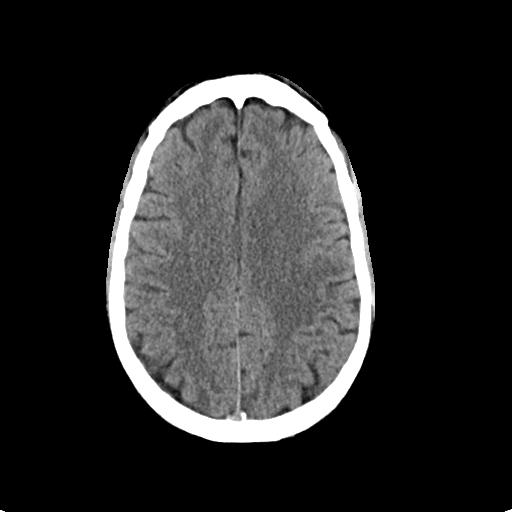

[Series 7: head angio · axial · 0.49mm/px · z∈[-140,-32]mm · 4 of 60 slices shown]
[im 12/60  brain]
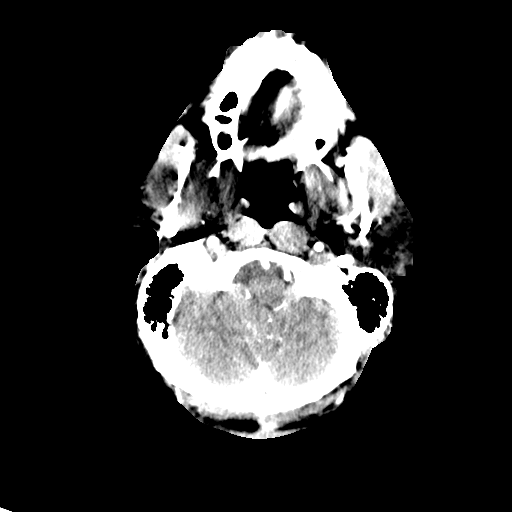
[im 24/60  bone]
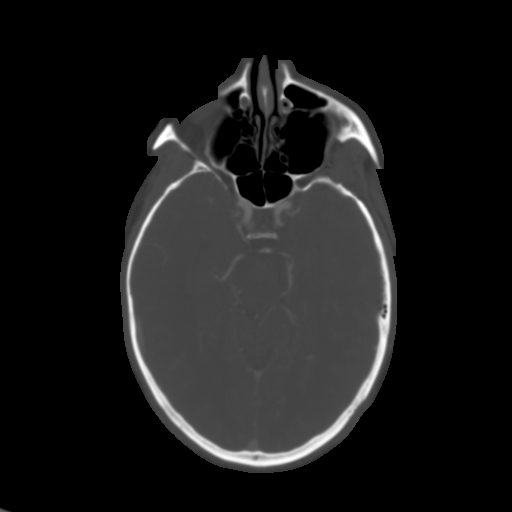
[im 36/60  brain]
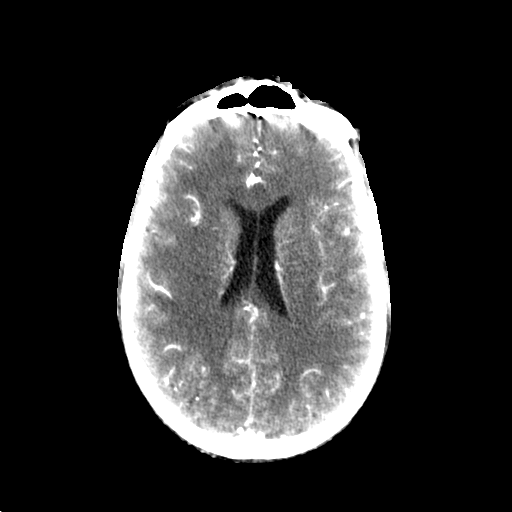
[im 48/60  bone]
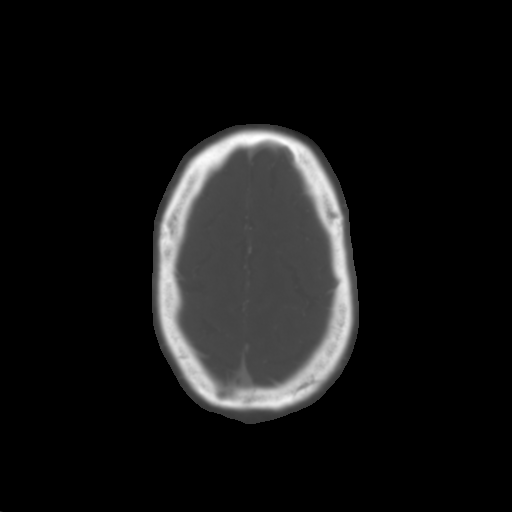

[6 of 30 positions shown; findings below may reference images not displayed]

FINDINGS: CT HEAD

Brain: No evidence of acute infarction, hemorrhage, hydrocephalus,
extra-axial collection or mass lesion/mass effect.

Vascular: Negative for hyperdense vessel

Skull: Negative

Sinuses: Mucosal edema/tendon cyst space to right maxillary sinus.
Remaining paranasal sinuses clear. Mastoid clear.

Orbits: Negative

CTA HEAD

Anterior circulation: Cavernous carotid widely patent without
atherosclerotic disease or stenosis.

1 mm infundibulum at the left posterior communicating artery origin
on the carotid. This appears to correlate with the small outpouching
on MRA. There is a tiny left posterior communicating artery.

Larger right posterior communicating artery. Anterior and middle
cerebral arteries widely patent bilaterally. No aneurysm.

Posterior circulation: Normal posterior circulation. No stenosis or
aneurysm.

Venous sinuses: Normal venous enhancement.

Anatomic variants: None
IMPRESSION: 1 mm infundibulum left posterior communicating artery origin on the
internal carotid artery accounts for the MRA finding. No aneurysm
identified

No intracranial stenosis.

## 2023-04-20 ENCOUNTER — Telehealth: Payer: Self-pay | Admitting: Family Medicine

## 2023-04-20 NOTE — Telephone Encounter (Signed)
LVM and sent MyChart message informing pt of r/s needed for 6/18 appt- Amy out early.

## 2023-05-03 ENCOUNTER — Ambulatory Visit: Payer: No Typology Code available for payment source | Admitting: Family Medicine
